# Patient Record
Sex: Male | Born: 1990 | Race: Black or African American | Hispanic: No | Marital: Single | Smoking: Never smoker
Health system: Southern US, Community
[De-identification: ages and names within clinical notes are randomized; demographics above are authoritative.]

## PROBLEM LIST (undated history)

## (undated) DIAGNOSIS — W3400XA Accidental discharge from unspecified firearms or gun, initial encounter: Secondary | ICD-10-CM

## (undated) HISTORY — PX: ABDOMINAL SURGERY: SHX537

---

## 2016-10-16 ENCOUNTER — Emergency Department (HOSPITAL_BASED_OUTPATIENT_CLINIC_OR_DEPARTMENT_OTHER): Admission: EM | Admit: 2016-10-16 | Discharge: 2016-10-17 | Payer: Self-pay

## 2016-10-16 ENCOUNTER — Emergency Department (HOSPITAL_BASED_OUTPATIENT_CLINIC_OR_DEPARTMENT_OTHER): Payer: Self-pay

## 2016-10-16 ENCOUNTER — Encounter (HOSPITAL_BASED_OUTPATIENT_CLINIC_OR_DEPARTMENT_OTHER): Payer: Self-pay | Admitting: *Deleted

## 2016-10-16 DIAGNOSIS — F1290 Cannabis use, unspecified, uncomplicated: Secondary | ICD-10-CM | POA: Insufficient documentation

## 2016-10-16 DIAGNOSIS — Y733 Surgical instruments, materials and gastroenterology and urology devices (including sutures) associated with adverse incidents: Secondary | ICD-10-CM | POA: Insufficient documentation

## 2016-10-16 DIAGNOSIS — T8143XA Infection following a procedure, organ and space surgical site, initial encounter: Secondary | ICD-10-CM

## 2016-10-16 DIAGNOSIS — A419 Sepsis, unspecified organism: Secondary | ICD-10-CM | POA: Insufficient documentation

## 2016-10-16 DIAGNOSIS — R1084 Generalized abdominal pain: Secondary | ICD-10-CM

## 2016-10-16 DIAGNOSIS — T814XXA Infection following a procedure, initial encounter: Secondary | ICD-10-CM | POA: Insufficient documentation

## 2016-10-16 DIAGNOSIS — K659 Peritonitis, unspecified: Secondary | ICD-10-CM | POA: Insufficient documentation

## 2016-10-16 DIAGNOSIS — Z79899 Other long term (current) drug therapy: Secondary | ICD-10-CM | POA: Insufficient documentation

## 2016-10-16 DIAGNOSIS — G8918 Other acute postprocedural pain: Secondary | ICD-10-CM

## 2016-10-16 DIAGNOSIS — T8149XA Infection following a procedure, other surgical site, initial encounter: Secondary | ICD-10-CM

## 2016-10-16 HISTORY — DX: Accidental discharge from unspecified firearms or gun, initial encounter: W34.00XA

## 2016-10-16 LAB — COMPREHENSIVE METABOLIC PANEL
ALK PHOS: 52 U/L (ref 38–126)
ALT: 27 U/L (ref 17–63)
ANION GAP: 11 (ref 5–15)
AST: 27 U/L (ref 15–41)
Albumin: 3.1 g/dL — ABNORMAL LOW (ref 3.5–5.0)
BILIRUBIN TOTAL: 1.9 mg/dL — AB (ref 0.3–1.2)
BUN: 14 mg/dL (ref 6–20)
CALCIUM: 8.5 mg/dL — AB (ref 8.9–10.3)
CO2: 26 mmol/L (ref 22–32)
Chloride: 94 mmol/L — ABNORMAL LOW (ref 101–111)
Creatinine, Ser: 0.69 mg/dL (ref 0.61–1.24)
GFR calc non Af Amer: >60 mL/min (ref >60)
Glucose, Bld: 105 mg/dL — ABNORMAL HIGH (ref 65–99)
Potassium: 3.1 mmol/L — ABNORMAL LOW (ref 3.5–5.1)
SODIUM: 131 mmol/L — AB (ref 135–145)
TOTAL PROTEIN: 6.7 g/dL (ref 6.5–8.1)

## 2016-10-16 LAB — CBC WITH DIFFERENTIAL/PLATELET
BASOS PCT: 0 %
Basophils Absolute: 0 10*3/uL (ref 0.0–0.1)
EOS ABS: 0 10*3/uL (ref 0.0–0.7)
Eosinophils Relative: 0 %
HCT: 24.5 % — ABNORMAL LOW (ref 39.0–52.0)
Hemoglobin: 8.5 g/dL — ABNORMAL LOW (ref 13.0–17.0)
Lymphocytes Relative: 5 %
Lymphs Abs: 1.2 10*3/uL (ref 0.7–4.0)
MCH: 31.5 pg (ref 26.0–34.0)
MCHC: 34.7 g/dL (ref 30.0–36.0)
MCV: 90.7 fL (ref 78.0–100.0)
Monocytes Absolute: 2.2 10*3/uL — ABNORMAL HIGH (ref 0.1–1.0)
Monocytes Relative: 9 %
NEUTROS PCT: 86 %
Neutro Abs: 21.2 10*3/uL — ABNORMAL HIGH (ref 1.7–7.7)
Platelets: 644 10*3/uL — ABNORMAL HIGH (ref 150–400)
RBC: 2.7 MIL/uL — AB (ref 4.22–5.81)
RDW: 11.8 % (ref 11.5–15.5)
WBC: 24.5 10*3/uL — AB (ref 4.0–10.5)

## 2016-10-16 LAB — URINALYSIS, ROUTINE W REFLEX MICROSCOPIC
Glucose, UA: NEGATIVE mg/dL
Hgb urine dipstick: NEGATIVE
NITRITE: NEGATIVE
PH: 6 (ref 5.0–8.0)
Protein, ur: 100 mg/dL — AB
Specific Gravity, Urine: 1.038 — ABNORMAL HIGH (ref 1.005–1.030)

## 2016-10-16 LAB — RAPID URINE DRUG SCREEN, HOSP PERFORMED
Amphetamines: NOT DETECTED
BARBITURATES: NOT DETECTED
Benzodiazepines: NOT DETECTED
Cocaine: NOT DETECTED
Opiates: POSITIVE — AB
Tetrahydrocannabinol: POSITIVE — AB

## 2016-10-16 LAB — URINALYSIS, MICROSCOPIC (REFLEX)

## 2016-10-16 LAB — LIPASE, BLOOD: Lipase: 24 U/L (ref 11–51)

## 2016-10-16 MED ORDER — SODIUM CHLORIDE 0.9 % IV SOLN
1000.0000 mL | Freq: Once | INTRAVENOUS | Status: AC
  Administered 2016-10-16: 1000 mL via INTRAVENOUS

## 2016-10-16 MED ORDER — ONDANSETRON HCL 4 MG/2ML IJ SOLN
4.0000 mg | Freq: Once | INTRAMUSCULAR | Status: AC
  Administered 2016-10-16: 4 mg via INTRAVENOUS
  Filled 2016-10-16: qty 2

## 2016-10-16 MED ORDER — HYDROMORPHONE HCL 1 MG/ML IJ SOLN
1.0000 mg | Freq: Once | INTRAMUSCULAR | Status: AC
  Administered 2016-10-17: 1 mg via INTRAVENOUS
  Filled 2016-10-16: qty 1

## 2016-10-16 MED ORDER — HYDROMORPHONE HCL 1 MG/ML IJ SOLN
1.0000 mg | Freq: Once | INTRAMUSCULAR | Status: AC
  Administered 2016-10-16: 1 mg via INTRAVENOUS
  Filled 2016-10-16: qty 1

## 2016-10-16 MED ORDER — IOPAMIDOL (ISOVUE-300) INJECTION 61%
100.0000 mL | Freq: Once | INTRAVENOUS | Status: AC | PRN
  Administered 2016-10-16: 100 mL via INTRAVENOUS

## 2016-10-16 MED ORDER — SODIUM CHLORIDE 0.9 % IV SOLN
1000.0000 mL | INTRAVENOUS | Status: DC
  Administered 2016-10-16: 1000 mL via INTRAVENOUS

## 2016-10-16 MED ORDER — SODIUM CHLORIDE 0.9 % IV BOLUS (SEPSIS)
1000.0000 mL | Freq: Once | INTRAVENOUS | Status: AC
  Administered 2016-10-16: 1000 mL via INTRAVENOUS
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
## 2016-10-16 NOTE — ED Notes (Signed)
Pt has a colostomy reversal performed at [facility] on 10/08/16. Pt was discharged with pain meds but states they are not working for him. Pt states he contacted his surgeon and he was to come to the ED.
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
## 2016-10-16 NOTE — ED Notes (Signed)
CT staff notified that pt is not able or willing to drink the oral contrast after vomiting multiple times.

## 2016-10-17 LAB — PROCALCITONIN: Procalcitonin: 0.55 ng/mL

## 2016-10-17 LAB — PROTIME-INR
INR: 1.25
Prothrombin Time: 15.8 seconds — ABNORMAL HIGH (ref 11.4–15.2)

## 2016-10-17 LAB — I-STAT CG4 LACTIC ACID, ED: Lactic Acid, Venous: 0.72 mmol/L (ref 0.5–1.9)

## 2016-10-17 LAB — APTT: APTT: 33 s (ref 24–36)

## 2016-10-17 MED ORDER — VANCOMYCIN HCL IN DEXTROSE 1-5 GM/200ML-% IV SOLN
1000.0000 mg | Freq: Once | INTRAVENOUS | Status: AC
  Administered 2016-10-17: 1000 mg via INTRAVENOUS
  Filled 2016-10-17: qty 200

## 2016-10-17 MED ORDER — PIPERACILLIN-TAZOBACTAM 3.375 G IVPB 30 MIN
3.3750 g | Freq: Once | INTRAVENOUS | Status: AC
  Administered 2016-10-17: 3.375 g via INTRAVENOUS
  Filled 2016-10-17 (×2): qty 50

## 2016-10-17 MED ORDER — HYDROMORPHONE HCL 1 MG/ML IJ SOLN
1.0000 mg | Freq: Once | INTRAMUSCULAR | Status: AC
  Administered 2016-10-17: 1 mg via INTRAVENOUS
  Filled 2016-10-17: qty 1

## 2016-10-17 MED ORDER — SODIUM CHLORIDE 0.9 % IV SOLN
Freq: Once | INTRAVENOUS | Status: AC
Start: 2016-10-17 — End: 2016-10-17
  Administered 2016-10-17: 01:00:00 via INTRAVENOUS

## 2016-10-17 MED ORDER — DIAZEPAM 5 MG/ML IJ SOLN
5.0000 mg | Freq: Once | INTRAMUSCULAR | Status: AC
  Administered 2016-10-17: 5 mg via INTRAVENOUS
  Filled 2016-10-17: qty 2

## 2016-10-17 MED ORDER — SODIUM CHLORIDE 0.9 % IV SOLN: 1000.0000 mL | Freq: Once | INTRAVENOUS | Status: DC

## 2016-10-17 MED ORDER — ACETAMINOPHEN 650 MG RE SUPP
650.0000 mg | Freq: Once | RECTAL | Status: AC
  Administered 2016-10-17: 650 mg via RECTAL
  Filled 2016-10-17: qty 1

## 2016-10-17 MED ORDER — SODIUM CHLORIDE 0.9 % IV SOLN: 1000.0000 mL | INTRAVENOUS | Status: DC

## 2016-10-17 MED FILL — Fentanyl Citrate Preservative Free (PF) Inj 100 MCG/2ML: INTRAMUSCULAR | Qty: 2 | Status: AC

## 2016-10-17 NOTE — ED Provider Notes (Signed)
12:48 AM.  [name] with Dr Mignon PineHoth, EGS service who accepts the patient in [facility].    [name], MD 10/17/16 [phone]

## 2016-10-17 NOTE — ED Provider Notes (Signed)
Took over care of the patient from Dr Donnald GarrePfeiffer at 12:40 AM.  [facility]. Pt with post operative abscess and free air. Peritonitis on exam. IV zosyn infusing now. Pt more comfortable. Temp 101.6. Tachycardia improving. [facility] need [facility]. NPO.    Azalia BilisKevin Eudell Julian, MD 10/17/16 [phone]

## 2016-10-17 NOTE — ED Notes (Signed)
ED Provider at bedside discussing test results and plan of care. 

## 2016-10-22 LAB — CULTURE, BLOOD (ROUTINE X 2)
CULTURE: NO GROWTH
Culture: NO GROWTH

## 2016-11-01 ENCOUNTER — Encounter (HOSPITAL_BASED_OUTPATIENT_CLINIC_OR_DEPARTMENT_OTHER): Payer: Self-pay | Admitting: *Deleted

## 2016-11-01 ENCOUNTER — Emergency Department (HOSPITAL_BASED_OUTPATIENT_CLINIC_OR_DEPARTMENT_OTHER): Payer: Self-pay

## 2016-11-01 ENCOUNTER — Emergency Department (HOSPITAL_BASED_OUTPATIENT_CLINIC_OR_DEPARTMENT_OTHER)
Admission: EM | Admit: 2016-11-01 | Discharge: 2016-11-02 | Disposition: A | Discharge status: Other Acute Inpatient Hospital | Payer: Self-pay | Attending: Emergency Medicine | Admitting: Emergency Medicine

## 2016-11-01 DIAGNOSIS — K6811 Postprocedural retroperitoneal abscess: Secondary | ICD-10-CM | POA: Insufficient documentation

## 2016-11-01 DIAGNOSIS — T8143XA Infection following a procedure, organ and space surgical site, initial encounter: Secondary | ICD-10-CM

## 2016-11-01 DIAGNOSIS — Z79899 Other long term (current) drug therapy: Secondary | ICD-10-CM | POA: Insufficient documentation

## 2016-11-01 DIAGNOSIS — K668 Other specified disorders of peritoneum: Secondary | ICD-10-CM

## 2016-11-01 DIAGNOSIS — T8149XA Infection following a procedure, other surgical site, initial encounter: Secondary | ICD-10-CM

## 2016-11-01 LAB — I-STAT CG4 LACTIC ACID, ED: LACTIC ACID, VENOUS: 0.85 mmol/L (ref 0.5–1.9)

## 2016-11-01 LAB — COMPREHENSIVE METABOLIC PANEL
ALT: 59 U/L (ref 17–63)
ANION GAP: 11 (ref 5–15)
AST: 27 U/L (ref 15–41)
Albumin: 2.8 g/dL — ABNORMAL LOW (ref 3.5–5.0)
Alkaline Phosphatase: 180 U/L — ABNORMAL HIGH (ref 38–126)
BUN: 10 mg/dL (ref 6–20)
CHLORIDE: 96 mmol/L — AB (ref 101–111)
CO2: 26 mmol/L (ref 22–32)
Calcium: 8.5 mg/dL — ABNORMAL LOW (ref 8.9–10.3)
Creatinine, Ser: 0.79 mg/dL (ref 0.61–1.24)
Glucose, Bld: 115 mg/dL — ABNORMAL HIGH (ref 65–99)
POTASSIUM: 3.4 mmol/L — AB (ref 3.5–5.1)
Sodium: 133 mmol/L — ABNORMAL LOW (ref 135–145)
Total Bilirubin: 0.6 mg/dL (ref 0.3–1.2)
Total Protein: 7.2 g/dL (ref 6.5–8.1)

## 2016-11-01 LAB — URINALYSIS, ROUTINE W REFLEX MICROSCOPIC
Bilirubin Urine: NEGATIVE
GLUCOSE, UA: NEGATIVE mg/dL
HGB URINE DIPSTICK: NEGATIVE
Ketones, ur: 15 mg/dL — AB
LEUKOCYTES UA: NEGATIVE
Nitrite: NEGATIVE
PH: 5.5 (ref 5.0–8.0)
Protein, ur: 30 mg/dL — AB
SPECIFIC GRAVITY, URINE: 1.026 (ref 1.005–1.030)

## 2016-11-01 LAB — URINALYSIS, MICROSCOPIC (REFLEX)

## 2016-11-01 LAB — CBC WITH DIFFERENTIAL/PLATELET
Basophils Absolute: 0 10*3/uL (ref 0.0–0.1)
Basophils Relative: 0 %
EOS PCT: 0 %
Eosinophils Absolute: 0 10*3/uL (ref 0.0–0.7)
HEMATOCRIT: 27.4 % — AB (ref 39.0–52.0)
Hemoglobin: 9.1 g/dL — ABNORMAL LOW (ref 13.0–17.0)
LYMPHS ABS: 0.9 10*3/uL (ref 0.7–4.0)
Lymphocytes Relative: 6 %
MCH: 30 pg (ref 26.0–34.0)
MCHC: 33.2 g/dL (ref 30.0–36.0)
MCV: 90.4 fL (ref 78.0–100.0)
MONO ABS: 1 10*3/uL (ref 0.1–1.0)
MONOS PCT: 7 %
NEUTROS ABS: 12.9 10*3/uL — AB (ref 1.7–7.7)
Neutrophils Relative %: 87 %
Platelets: 560 10*3/uL — ABNORMAL HIGH (ref 150–400)
RBC: 3.03 MIL/uL — AB (ref 4.22–5.81)
RDW: 13.8 % (ref 11.5–15.5)
WBC: 14.8 10*3/uL — AB (ref 4.0–10.5)

## 2016-11-01 MED ORDER — MORPHINE SULFATE (PF) 4 MG/ML IV SOLN
4.0000 mg | Freq: Once | INTRAVENOUS | Status: AC
  Administered 2016-11-01: 4 mg via INTRAVENOUS
  Filled 2016-11-01: qty 1

## 2016-11-01 MED ORDER — ONDANSETRON HCL 4 MG/2ML IJ SOLN
4.0000 mg | Freq: Once | INTRAMUSCULAR | Status: AC
  Administered 2016-11-01: 4 mg via INTRAVENOUS
  Filled 2016-11-01: qty 2

## 2016-11-01 MED ORDER — IOPAMIDOL (ISOVUE-300) INJECTION 61%
100.0000 mL | Freq: Once | INTRAVENOUS | Status: AC | PRN
  Administered 2016-11-01: 100 mL via INTRAVENOUS

## 2016-11-01 MED ORDER — ACETAMINOPHEN 325 MG PO TABS
650.0000 mg | ORAL_TABLET | Freq: Once | ORAL | Status: AC
  Administered 2016-11-01: 650 mg via ORAL
  Filled 2016-11-01: qty 2

## 2016-11-01 MED ORDER — SODIUM CHLORIDE 0.9 % IV BOLUS (SEPSIS)
1000.0000 mL | Freq: Once | INTRAVENOUS | Status: AC
  Administered 2016-11-01: 1000 mL via INTRAVENOUS
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
## 2016-11-01 NOTE — ED Notes (Signed)
ED Provider at bedside. 

## 2016-11-01 NOTE — ED Notes (Signed)
Okay to give pt supplies to change his dressing per Shanda Bumps, NP

## 2016-11-01 NOTE — ED Notes (Signed)
Pt given a urinal to attempt urine sample. Pt stated "I'm unable to pee right now."

## 2016-11-01 NOTE — ED Triage Notes (Signed)
Pt reports surgery on 10/08/2016 for colostomy reversal and was DC'd 10/14/2016. Pt reports coming to ED around 10/16/2016 d/t pain and n/v and was transferred to [facility] for additional surgery. Reports at follow-up appt he was unable to get additional pain med prescription so he has been getting pain meds from other people. Pt reports developing fever last night. Also reports n/v/d.
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
## 2016-11-01 NOTE — ED Notes (Signed)
Encouraged pt to drink po contrast and explained why drinking contrast was important for accurate results. Pt. Refused with myself and with CT scan.

## 2016-11-01 NOTE — ED Notes (Addendum)
Pt 2 weeks post op c/o severe abdominal pain and fever.  Pt reports he has been taking drugs from friends for pain "perc 5's" because he was not discharged from [facility] with Rx for any pain medications other than tramadol and pt states he will not take tramadol because he "heard it causes cancer."  Per medical records from [facility], it appears pt should have received Rx for Percocet & Tramadol, along with instructions to taper pain management to tylenol.  Pt denies ever receiving a Rx for percocets and states at his follow-up appt on 10/28/16, he was told he would not receive any additional narcotic Rx's as it was in his plan to have his pain mgt taper to non-narcotic pain relief measures.    Pt reports taking hydrocodone from friend at approx 1800 today.  Pt states mid back and diffuse abdomen are hurting.  Pt reports fever at home 2-3 days ago along with pain that has been progressively worsening.  Pt admits to smoking marijuana to help with pain but states it and the hydrocodone "only took the edge off."

## 2016-11-02 MED ORDER — PIPERACILLIN-TAZOBACTAM 3.375 G IVPB
3.3750 g | Freq: Once | INTRAVENOUS | Status: AC
  Administered 2016-11-02: 3.375 g via INTRAVENOUS
  Filled 2016-11-02: qty 50

## 2016-11-02 MED ORDER — ACETAMINOPHEN 325 MG PO TABS
975.0000 mg | ORAL_TABLET | Freq: Once | ORAL | Status: AC
  Administered 2016-11-02: 975 mg via ORAL
  Filled 2016-11-02: qty 3

## 2016-11-02 NOTE — ED Notes (Signed)
MD aware that pt has a temp of 101. Orders received for tylenol

## 2016-11-02 NOTE — ED Notes (Signed)
ED Provider at bedside. 

## 2016-11-02 NOTE — ED Notes (Signed)
Ice Chips given to Pt per PA.

## 2016-11-02 NOTE — ED Notes (Signed)
Contacted [facility] for general surgery consult

## 2017-05-06 IMAGING — CT CT ABD-PELV W/ CM
2 of 5 series · 14 of 46 positions shown, 16 images · IV contrast (APPLIED)
Comparison: 10/16/2016 CT

CLINICAL DATA: Fever starting last evening. Patient had surgery on
10/08/2016 for colostomy reversal, seen on 10/16/2016 with
pneumoperitoneum and diagnosed with abdominal wall open wound on
10/28/2016.

EXAM:
CT ABDOMEN AND PELVIS WITH CONTRAST
TECHNIQUE: Multidetector CT imaging of the abdomen and pelvis was performed
using the standard protocol following bolus administration of
intravenous contrast.
CONTRAST:  100mL [id number] IOPAMIDOL ([id number]-FWW) INJECTION 61%

[Series 2: axial st · axial · 0.77mm/px · z∈[-716,-286]mm · 11 of 98 slices shown, 13 images]
[im 6/98  soft-tissue]
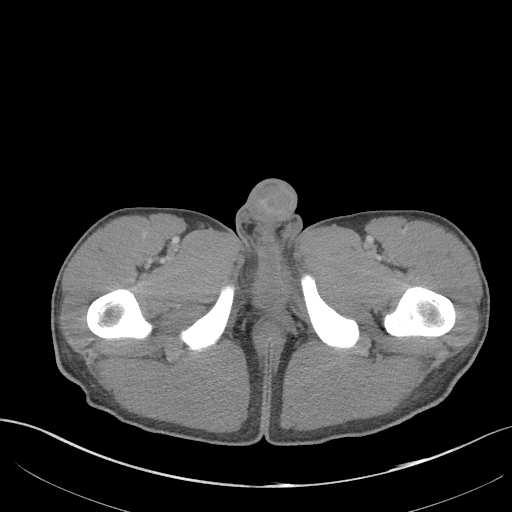
[im 6/98  bone]
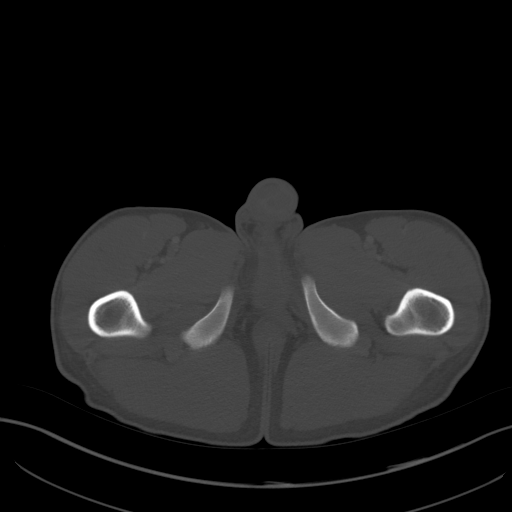
[im 16/98  soft-tissue]
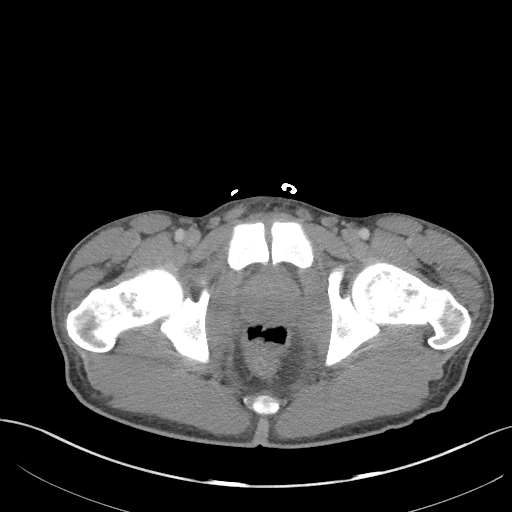
[im 26/98  soft-tissue]
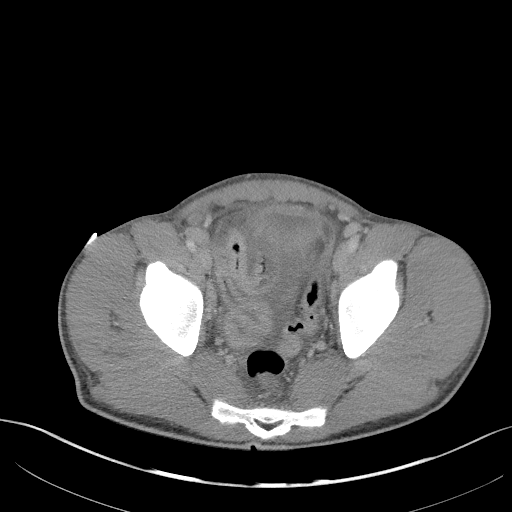
[im 31/98  soft-tissue]
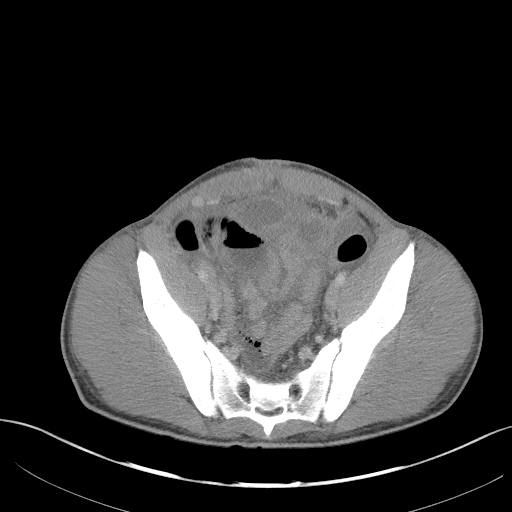
[im 41/98  soft-tissue]
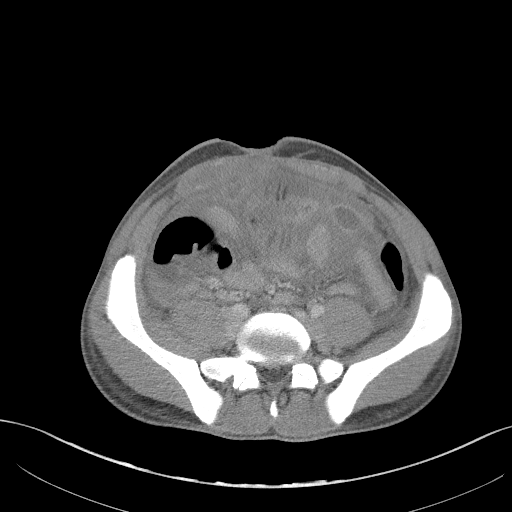
[im 52/98  soft-tissue]
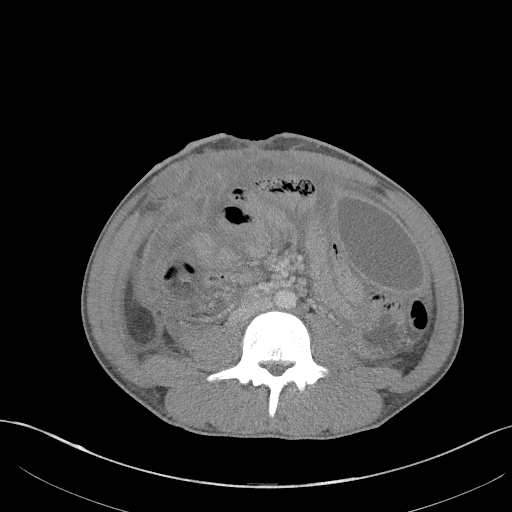
[im 57/98  soft-tissue]
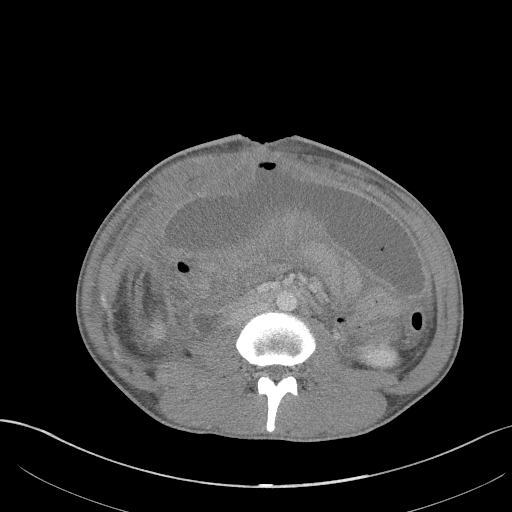
[im 67/98  soft-tissue]
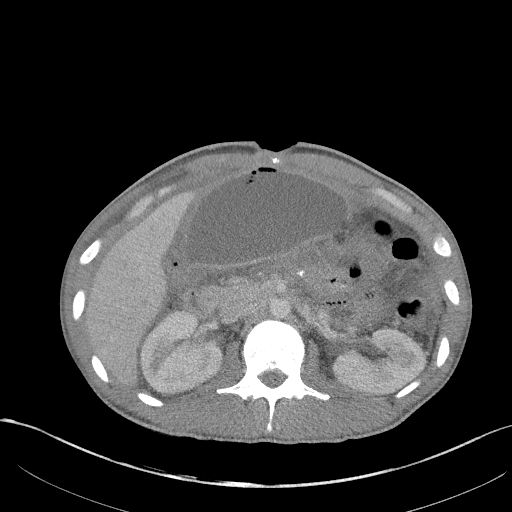
[im 72/98  soft-tissue]
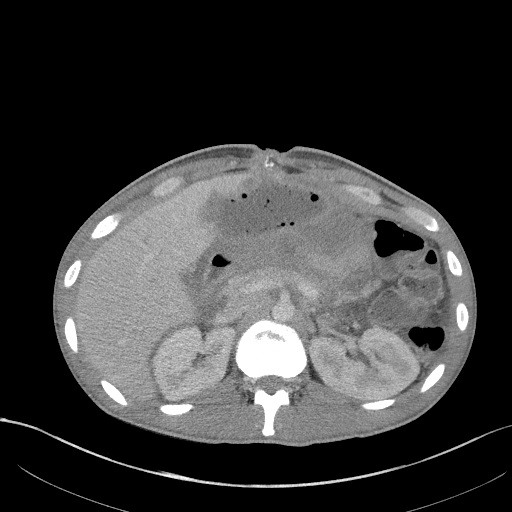
[im 72/98  bone]
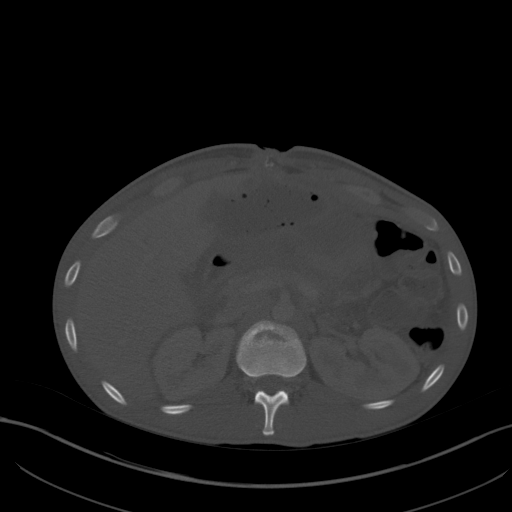
[im 82/98  soft-tissue]
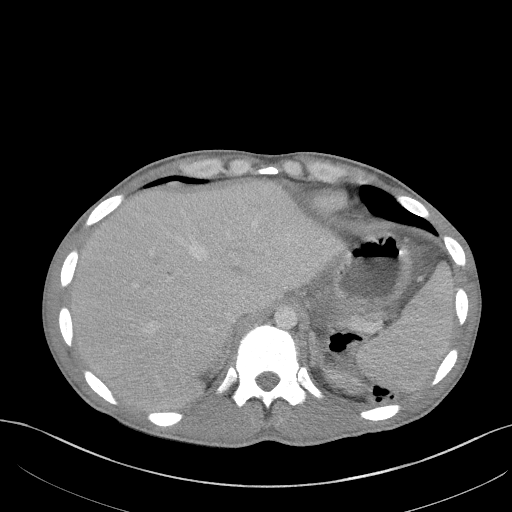
[im 92/98  soft-tissue]
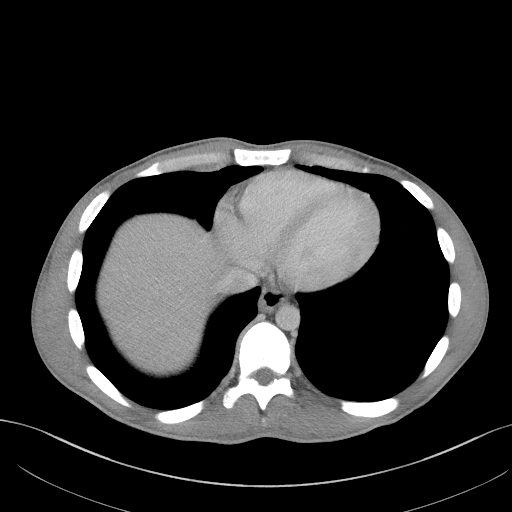

[Series 5: coronal st · coronal · 0.70mm/px · 3 of 101 slices shown]
[im 34/101  soft-tissue]
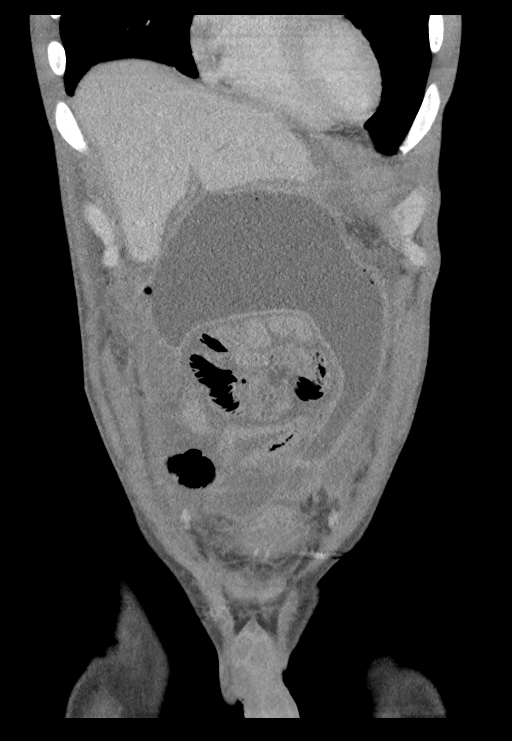
[im 45/101  soft-tissue]
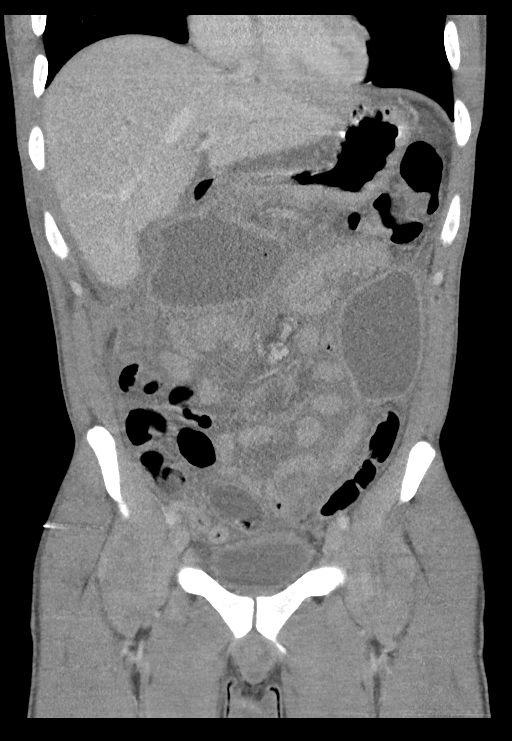
[im 56/101  soft-tissue]
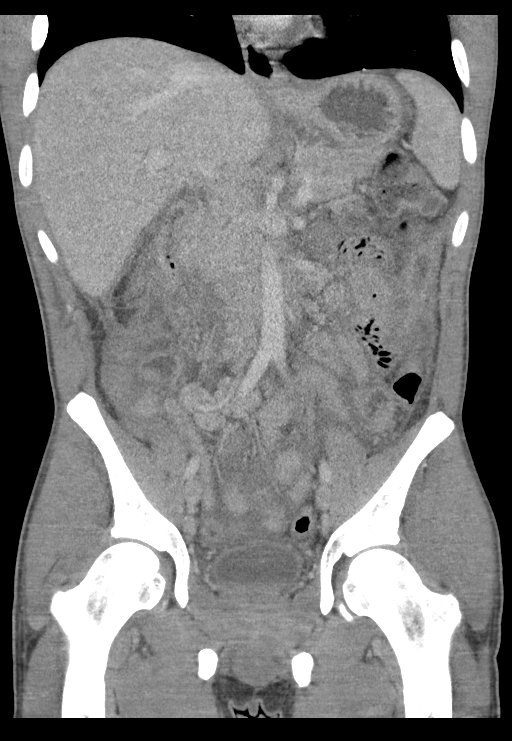

[14 of 46 positions shown; findings below may reference images not displayed]

FINDINGS: Lower chest: Normal size cardiac chambers. No pericardial effusion.
Small hiatal hernia. Clear lung bases.

Hepatobiliary: No space-occupying mass of the liver nor abscess.
Mild intrahepatic ductal dilatation. Gallbladder is not well
visualized and may either be contracted or surgically absent.

Pancreas: The pancreas is grossly unremarkable without evidence of
ductal dilatation or focal mass.

Spleen: No splenomegaly or mass.

Adrenals/Urinary Tract: Normal bilateral adrenal glands. No
enhancing renal mass nor obstructive uropathy. Physiologically
distended bladder.

Stomach/Bowel: Contracted stomach. Normal small bowel rotation. Mild
fluid-filled and air-filled distention of small bowel loops in the
mid lower abdomen and right lower quadrant possibly from a localized
ileus. There appears to mild mural thickening of the ascending
colon, series 2, image 51. Chain sutures presumably from colostomy
reversal are seen in the upper mid abdomen deep to the abscess.

Vascular/Lymphatic: The abdominal aorta is unremarkable in
appearance. No retroperitoneal nor pelvic sidewall adenopathy. The
IVC is unremarkable.

Reproductive: No acute abnormality.  Top-normal size prostate.

Other: There is a large enhancing complex fluid collection
consistent with an abscess containing foci of gas measuring 20.7 x
8.2 x 8.9 cm overlying the expected location of the transverse colon
and descending colon, terminating over the region of the mid
sigmoid. Small foci of pneumoperitoneum are noted. Mottled hypodense
fluid collections within a mildly enlarged right rectus muscle deep
to the skin incision may represent intramuscular abscess ease of the
rectus. The largest of these is estimated at 2 cm. Lack of enteric
contrast in this region limits assessment. Ventral wound to the
right of the umbilicus is also noted. Mesenteric edema is seen.

Musculoskeletal: No acute or significant osseous findings.
IMPRESSION: 1. Enhancing abscess collection is noted overlying the region of the
transverse, descending to mid sigmoid colon estimated at 20.7 x
x 8.9 cm. There is a small amount of pneumoperitoneum in the upper
abdomen.
2. Multi-cystic appearance of the right rectus muscle deep to the
skin incisional wound may represent intramuscular abscesses of the
right rectus, series 2, image 48.
3. Localized ileus involving distal small bowel in the right lower
quadrant of the abdomen. There is mild mural thickening suggested of
non-opacified large bowel potentially representing areas of colitis.
4. Critical Value/emergent results were called by telephone at the
[facility] , who verbally acknowledged these results.
